# Patient Record
Sex: Male | Born: 1990 | Race: Black or African American | Hispanic: No | Marital: Single | Smoking: Never smoker
Health system: Southern US, Community
[De-identification: ages and names within clinical notes are randomized; demographics above are authoritative.]

## PROBLEM LIST (undated history)

## (undated) DIAGNOSIS — R569 Unspecified convulsions: Secondary | ICD-10-CM

---

## 2017-02-28 ENCOUNTER — Emergency Department (HOSPITAL_BASED_OUTPATIENT_CLINIC_OR_DEPARTMENT_OTHER): Payer: Medicaid Other

## 2017-02-28 ENCOUNTER — Emergency Department (HOSPITAL_BASED_OUTPATIENT_CLINIC_OR_DEPARTMENT_OTHER)
Admission: EM | Admit: 2017-02-28 | Discharge: 2017-02-28 | Disposition: A | Discharge status: Home / Self Care | Payer: Medicaid Other | Attending: Emergency Medicine | Admitting: Emergency Medicine

## 2017-02-28 ENCOUNTER — Encounter (HOSPITAL_BASED_OUTPATIENT_CLINIC_OR_DEPARTMENT_OTHER): Payer: Self-pay | Admitting: Emergency Medicine

## 2017-02-28 DIAGNOSIS — S6292XA Unspecified fracture of left wrist and hand, initial encounter for closed fracture: Secondary | ICD-10-CM | POA: Insufficient documentation

## 2017-02-28 DIAGNOSIS — Y929 Unspecified place or not applicable: Secondary | ICD-10-CM | POA: Insufficient documentation

## 2017-02-28 DIAGNOSIS — Z79899 Other long term (current) drug therapy: Secondary | ICD-10-CM | POA: Insufficient documentation

## 2017-02-28 DIAGNOSIS — S6992XA Unspecified injury of left wrist, hand and finger(s), initial encounter: Secondary | ICD-10-CM | POA: Diagnosis present

## 2017-02-28 DIAGNOSIS — S62002A Unspecified fracture of navicular [scaphoid] bone of left wrist, initial encounter for closed fracture: Secondary | ICD-10-CM

## 2017-02-28 DIAGNOSIS — Y9367 Activity, basketball: Secondary | ICD-10-CM | POA: Diagnosis not present

## 2017-02-28 DIAGNOSIS — W0110XA Fall on same level from slipping, tripping and stumbling with subsequent striking against unspecified object, initial encounter: Secondary | ICD-10-CM | POA: Diagnosis not present

## 2017-02-28 DIAGNOSIS — S62102A Fracture of unspecified carpal bone, left wrist, initial encounter for closed fracture: Secondary | ICD-10-CM

## 2017-02-28 DIAGNOSIS — Y999 Unspecified external cause status: Secondary | ICD-10-CM | POA: Diagnosis not present

## 2017-02-28 HISTORY — DX: Unspecified convulsions: R56.9

## 2017-02-28 MED ORDER — OXYCODONE-ACETAMINOPHEN 5-325 MG PO TABS
1.0000 | ORAL_TABLET | Freq: Once | ORAL | Status: AC
  Administered 2017-02-28: 1 via ORAL
  Filled 2017-02-28: qty 1

## 2017-02-28 MED ORDER — HYDROMORPHONE HCL 1 MG/ML IJ SOLN
1.0000 mg | Freq: Once | INTRAMUSCULAR | Status: AC
  Administered 2017-02-28: 1 mg via INTRAMUSCULAR
  Filled 2017-02-28: qty 1

## 2017-02-28 MED ORDER — ONDANSETRON 4 MG PO TBDP
4.0000 mg | ORAL_TABLET | Freq: Once | ORAL | Status: AC
  Administered 2017-02-28: 4 mg via ORAL
  Filled 2017-02-28: qty 1

## 2017-02-28 MED ORDER — NAPROXEN 500 MG PO TABS: 500.0000 mg | ORAL_TABLET | Freq: Two times a day (BID) | ORAL | 0 refills | Status: AC

## 2017-02-28 MED ORDER — HYDROCODONE-ACETAMINOPHEN 5-325 MG PO TABS: ORAL_TABLET | ORAL | 0 refills | Status: DC

## 2017-02-28 NOTE — Discharge Instructions (Signed)
Please read and follow all provided instructions.  Your diagnoses today include:  1. Avulsion fracture of left wrist   2. Closed displaced fracture of navicular bone of left foot, sequela     Tests performed today include:  An x-ray of the affected area - shows chip fracture of wrist, old wrist fracture with some bone breakdown  Vital signs. See below for your results today.   Medications prescribed:   Vicodin (hydrocodone/acetaminophen) - narcotic pain medication  DO NOT drive or perform any activities that require you to be awake and alert because this medicine can make you drowsy. BE VERY CAREFUL not to take multiple medicines containing Tylenol (also called acetaminophen). Doing so can lead to an overdose which can damage your liver and cause liver failure and possibly death.   Naproxen - anti-inflammatory pain medication  Do not exceed  naproxen every 12 hours, take with food  You have been prescribed an anti-inflammatory medication or NSAID. Take with food. Take smallest effective dose for the shortest duration needed for your pain. Stop taking if you experience stomach pain or vomiting.   Take any prescribed medications only as directed.  Home care instructions:   Follow any educational materials contained in this packet  Follow R.I.C.E. Protocol:  R - rest your injury   I  - use ice on injury without applying directly to skin  C - compress injury with bandage or splint  E - elevate the injury as much as possible  Follow-up instructions: Please follow-up with the hand doctor for further work-up.  Return instructions:   Please return if your fingers are numb or tingling, appear gray or blue, or you have severe pain (also elevate the arm and loosen splint or wrap if you were given one)  Please return to the Emergency Department if you experience worsening symptoms.   Please return if you have any other emergent concerns.  Additional Information:  Your  vital signs today were: BP (!) 147/99 (BP Location: Right Arm)    Pulse 72    Temp 97.8 F (36.6 C) (Oral)    Resp 18    Ht  (1.803 m)    Wt 81.6 kg (180 lb)    SpO2 100%    BMI 25.10 kg/m  If your blood pressure (BP) was elevated above 135/85 this visit, please have this repeated by your doctor within one month. --------------

## 2017-02-28 NOTE — ED Notes (Signed)
ED Provider at bedside. 

## 2017-02-28 NOTE — ED Notes (Signed)
Pt verbalized understanding of discharge instructions and denies any further questions at this time.   

## 2017-02-28 NOTE — ED Provider Notes (Addendum)
[facility] Provider [facility]   CSN: [id number] Arrival date & time: 02/28/17  1418     History   Chief Complaint Chief Complaint  Patient presents with  . Wrist Pain    HPI Kevin Sherman is a 26 y.o. male.  Patient who is right-handed, presents with complaint of left hand and wrist pain starting acutely at approximately 2 AM today when he fell while playing basketball. Patient fell onto an outstretched left wrist and hand. Patient had immediate severe pain. Patient states that he went home and could not sleep or change his newborn's diaper because of the pain. 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 He has had previous injuries which he did not seek medical care for. No treatments prior to arrival.       Past Medical History:  Diagnosis Date  . Seizures (HCC)     There are no active problems to display for this patient.   History reviewed. No pertinent surgical history.     Home Medications    Prior to Admission medications   Medication Sig Start Date End Date Taking? Authorizing Provider  phenytoin (DILANTIN) 300 MG ER capsule Take 400 mg by mouth daily.   Yes [provider]    Family History History reviewed. No pertinent family history.  Social History Social History  Substance Use Topics  . Smoking status: Never Smoker  . Smokeless tobacco: Never Used  . Alcohol use No     Allergies   Patient has no known allergies.   Review of Systems Review of Systems  Constitutional: Negative for activity change.  Musculoskeletal: Positive for arthralgias. Negative for back pain, gait problem, joint swelling and neck pain.  Skin: Negative for wound.  Neurological: Negative for weakness and numbness.     Physical Exam Updated Vital Signs BP (!) 160/95 (BP Location: Left Arm)   Pulse 82   Temp 97.8 F (36.6 C) (Oral)   Resp 18   Ht 5\' 11"  (1.803 m)   Wt 81.6 kg (180 lb)   SpO2 100%   BMI 25.10 kg/m   Physical Exam  Constitutional:  He appears well-developed and well-nourished.  HENT:  Head: Normocephalic and atraumatic.  Eyes: Conjunctivae are normal.  Neck: Normal range of motion. Neck supple.  Cardiovascular: Normal pulses.  Exam reveals no decreased pulses.   Pulses:      Radial pulses are 2+ on the right side, and 2+ on the left side.  Musculoskeletal: He exhibits tenderness. He exhibits no edema.       Left shoulder: Normal.       Left elbow: Normal.       Left wrist: He exhibits decreased range of motion, tenderness, bony tenderness and swelling.       Left forearm: Normal.       Left hand: He exhibits decreased range of motion, tenderness, bony tenderness (base of thumb) and swelling. He exhibits normal capillary refill, no deformity and no laceration.  Neurological: He is alert. No sensory deficit.  Motor, sensation, and vascular distal to the injury is fully intact.   Skin: Skin is warm and dry.  Psychiatric: He has a normal mood and affect.  Nursing note and vitals reviewed.    ED Treatments / Results  Labs (all labs ordered are listed, but only abnormal results are displayed) Labs Reviewed - No data to display  EKG  EKG Interpretation None       Radiology Dg Wrist Complete Left  Result Date: 02/28/2017  CLINICAL DATA:  26 year old male status post fall playing basketball last night. Hand and wrist pain and swelling. EXAM: LEFT WRIST - COMPLETE 3+ VIEW COMPARISON:  None. FINDINGS: The distal left ulna appears intact. There is a small crescent-shaped ossific fragment at left radial styloid along the radial carpal joint space (arrow). The distal left radius otherwise appears intact. There is an age indeterminate left scaphoid fracture with up to 2 mm of distraction of the proximal and distal scaphoid fragments. There is up to mild abnormal sclerosis of the scaphoid. There is subsequent mild subluxation of the proximal carpal row, but elsewhere the carpal bones appear intact and normally aligned. The  visible metacarpals appear intact. IMPRESSION: 1. Age indeterminate mildly distracted left scaphoid fracture. 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 Associated mild subluxation of the proximal carpal row. 2. Suspected acute chip or avulsion fracture at the nearby left radial styloid. Electronically Signed   By: Odessa Fleming M.D.   On: 02/28/2017 14:54   Dg Hand Complete Left  Result Date: 02/28/2017 CLINICAL DATA:  26 year old male status post fall playing basketball last night. Hand and wrist pain and swelling. EXAM: LEFT HAND - COMPLETE 3+ VIEW COMPARISON:  Left wrist series today reported separately. FINDINGS: Abnormal left wrist findings today are reported separately. Left metacarpals and phalanges are intact with normal alignment and joint spaces. IMPRESSION: 1. Abnormal left wrist, see left wrist series today. 2. Normal left metacarpals and phalanges. Electronically Signed   By: Odessa Fleming M.D.   On: 02/28/2017 14:55    Procedures Procedures (including critical care time)  Medications Ordered in ED Medications  oxyCODONE-acetaminophen (PERCOCET/ROXICET) 5-325 MG per tablet 1 tablet (1 tablet Oral Given 02/28/17 1600)  HYDROmorphone (DILAUDID) injection 1 mg (1 mg Intramuscular Given 02/28/17 1642)  ondansetron (ZOFRAN-ODT) disintegrating tablet 4 mg (4 mg Oral Given 02/28/17 1642)     Initial Impression / Assessment and Plan / ED Course  I have reviewed the triage vital signs and the nursing notes.  Pertinent labs & imaging results that were available during my care of the patient were reviewed by me and considered in my medical decision making (see chart for details).     Patient seen and examined. Reviewed x-ray with patient. We had a long discussion regarding importance of having navicular fracture evaluated and discussion of bone necrosis. Will splint given suspected avulsion fracture.  Patient is in significant pain. I do  not see any signs of ischemia to the hand or signs of compartment syndrome of the forearm. Will give IM dilaudid given fracture to obtain better pain control prior to discharge.   Vital signs reviewed and are as follows: BP (!) 147/99 (BP Location: Right Arm)   Pulse 72   Temp 97.8 F (36.6 C) (Oral)   Resp 18   Ht  (1.803 m)   Wt 81.6 kg (180 lb)   SpO2 100%   BMI 25.10 kg/m   5:15 PM Patients with significant pain relief with IM dilaudid. Will discharge to home with #8 Vicodin and naproxen. Patient encouraged to continue elevation, rice protocol.  Patient counseled on use of narcotic pain medications. Counseled not to combine these medications with others containing tylenol. Urged not to drink alcohol, drive, or perform any other activities that requires focus while taking these medications. The patient verbalizes understanding and agrees with the plan.  Patient was placed in a Velcro thumb spica wrist splint. I considered  fiberglass, however I think that this will be fine for follow-up. Given appearance of fracture and the lumen of sclerosis, do not suspect that the navicular fracture is acute. Suspect this is likely from a previous boxing injury. Fracture today is a small avulsion fracture.  Final Clinical Impressions(s) / ED Diagnoses   Final diagnoses:  Avulsion fracture of left wrist  Closed nondisplaced fracture of scaphoid of left wrist, unspecified portion of scaphoid, initial encounter   Patient with left wrist injury, splinted. 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 Hand is otherwise neurovascularly intact. No signs of compartment syndrome of the forearm. No elbow or shoulder pain.  New Prescriptions New Prescriptions   HYDROCODONE-ACETAMINOPHEN (NORCO/VICODIN) 5-325 MG TABLET    Take 1-2 tablets every 6 hours as needed for severe pain   NAPROXEN (NAPROSYN) 500 MG TABLET    Take 1 tablet (500 mg total)  by mouth 2 (two) times daily.     Renne Crigler, PA-C 02/28/17 1719    Nira Conn, MD 02/28/17 1741    Renne Crigler, PA-C 03/09/17 1609    Renne Crigler, PA-C 03/09/17 1610    Nira Conn, MD 03/13/17 1754

## 2017-02-28 NOTE — ED Notes (Signed)
Ice Pack on pt elevating extremity

## 2017-02-28 NOTE — ED Triage Notes (Signed)
Patient states that he was playing basketball last night and he fell onto his left hand and wrist. THe patient has swelling noted to his left hand and wrist

## 2017-02-28 NOTE — ED Notes (Signed)
Pt states that he does have a means of transportation and will not drive.

## 2018-05-25 ENCOUNTER — Emergency Department (HOSPITAL_BASED_OUTPATIENT_CLINIC_OR_DEPARTMENT_OTHER): Payer: Medicaid Other

## 2018-05-25 ENCOUNTER — Encounter (HOSPITAL_BASED_OUTPATIENT_CLINIC_OR_DEPARTMENT_OTHER): Payer: Self-pay | Admitting: Emergency Medicine

## 2018-05-25 ENCOUNTER — Other Ambulatory Visit: Payer: Self-pay

## 2018-05-25 ENCOUNTER — Emergency Department (HOSPITAL_BASED_OUTPATIENT_CLINIC_OR_DEPARTMENT_OTHER)
Admission: EM | Admit: 2018-05-25 | Discharge: 2018-05-26 | Disposition: A | Discharge status: Home / Self Care | Payer: Medicaid Other | Attending: Emergency Medicine | Admitting: Emergency Medicine

## 2018-05-25 DIAGNOSIS — Y92830 Public park as the place of occurrence of the external cause: Secondary | ICD-10-CM | POA: Insufficient documentation

## 2018-05-25 DIAGNOSIS — S99912A Unspecified injury of left ankle, initial encounter: Secondary | ICD-10-CM | POA: Diagnosis present

## 2018-05-25 DIAGNOSIS — Y9339 Activity, other involving climbing, rappelling and jumping off: Secondary | ICD-10-CM | POA: Insufficient documentation

## 2018-05-25 DIAGNOSIS — T1490XA Injury, unspecified, initial encounter: Secondary | ICD-10-CM

## 2018-05-25 DIAGNOSIS — S82842A Displaced bimalleolar fracture of left lower leg, initial encounter for closed fracture: Secondary | ICD-10-CM | POA: Insufficient documentation

## 2018-05-25 DIAGNOSIS — W098XXA Fall on or from other playground equipment, initial encounter: Secondary | ICD-10-CM | POA: Diagnosis not present

## 2018-05-25 DIAGNOSIS — Y998 Other external cause status: Secondary | ICD-10-CM | POA: Insufficient documentation

## 2018-05-25 DIAGNOSIS — S82843A Displaced bimalleolar fracture of unspecified lower leg, initial encounter for closed fracture: Secondary | ICD-10-CM

## 2018-05-25 MED ORDER — HYDROMORPHONE HCL 1 MG/ML IJ SOLN
1.0000 mg | Freq: Once | INTRAMUSCULAR | Status: AC
Start: 2018-05-25 — End: 2018-05-25
  Administered 2018-05-25: 1 mg via INTRAVENOUS
  Filled 2018-05-25: qty 1

## 2018-05-25 MED ORDER — ONDANSETRON HCL 4 MG/2ML IJ SOLN
4.0000 mg | Freq: Once | INTRAMUSCULAR | Status: AC
  Administered 2018-05-25: 4 mg via INTRAVENOUS
  Filled 2018-05-25: qty 2

## 2018-05-25 MED ORDER — FENTANYL CITRATE (PF) 100 MCG/2ML IJ SOLN
50.0000 ug | INTRAMUSCULAR | Status: DC | PRN
  Administered 2018-05-25: 50 ug via INTRAVENOUS
  Filled 2018-05-25: qty 2

## 2018-05-25 MED ORDER — MORPHINE SULFATE (PF) 4 MG/ML IV SOLN
4.0000 mg | Freq: Once | INTRAVENOUS | Status: AC
Start: 2018-05-25 — End: 2018-05-25
  Administered 2018-05-25: 4 mg via INTRAVENOUS
  Filled 2018-05-25: qty 1

## 2018-05-25 NOTE — ED Triage Notes (Signed)
Patient states he fell from the top of the playground equipment/slide; co left ankle pain and swelling; unable to bear weight on left ankle; states onset 1 hour ago.

## 2018-05-25 NOTE — ED Notes (Signed)
ED Provider at bedside. 

## 2018-05-25 NOTE — ED Notes (Signed)
Fell several feet landing on feet  More so on left leg and foot  Increased pain and swelling to left ankle and foot  Pos pedal pulse

## 2018-05-25 NOTE — Discharge Instructions (Addendum)
You have been seen today for an ankle injury.  There were breaks to some of the bones noted in the ankle and it is prudent that you follow-up with the orthopedic doctors. Acetaminophen (generic for Tylenol): Should you continue to have additional pain while taking the ibuprofen or naproxen, you may add in acetaminophen as needed. Your daily total maximum amount of acetaminophen from all sources should be limited to 4000mg /day for persons without liver problems, or 2000mg /day for those with liver problems. Elevation: Keep the extremity elevated as often as possible to reduce pain and inflammation. Support: Keep the splint clean and dry.  You will be nonweightbearing, which means you should not put any weight on the injured leg. Follow up: Call the office of the orthopedist on Monday, December 9 for an appointment within 2 days. Return: Return to the ED for numbness, weakness, increasing pain, overall worsening symptoms, loss of function, or if symptoms are not improving, you have tried to follow up with the orthopedic specialist, and have been unable to do so.  For prescription assistance, may try using prescription discount sites or apps, such as goodrx.com

## 2018-05-25 NOTE — ED Provider Notes (Signed)
[facility] EMERGENCY DEPARTMENT Provider Note   CSN: [id number] Arrival date & time: 05/25/18  1927     History   Chief Complaint Chief Complaint  Patient presents with  . Ankle Pain    HPI Kevin Sherman is a 27 y.o. male.  HPI   Kevin Sherman is a 27 y.o. male, with a history of seizures, presenting to the ED with injury to the left ankle that occurred around 5 PM this evening. Patient was at the park with his children, climbing on a jungle gym, lost his balance and fell 5 to 7 feet to the ground, landing on his left heel. Pain is throughout the left ankle, 10/10, sharp and throbbing, radiating proximally into the left lower leg. Denies head injury, neck/back pain, hip pain, abdominal pain, LOC, or any other complaints.   Past Medical History:  Diagnosis Date  . Seizures (HCC)     There are no active problems to display for this patient.   History reviewed. No pertinent surgical history.      Home Medications    Prior to Admission medications   Medication Sig Start Date End Date Taking? Authorizing Provider  HYDROcodone-acetaminophen (NORCO/VICODIN) 5-325 MG tablet Take 1-2 tablets every 6 hours as needed for severe pain 02/28/17   Renne CriglerGeiple, Joshua, PA-C  naproxen (NAPROSYN) 500 MG tablet Take 1 tablet (500 mg total) by mouth 2 (two) times daily. 02/28/17   Renne CriglerGeiple, Joshua, PA-C  phenytoin (DILANTIN) 300 MG ER capsule Take 400 mg by mouth daily.    [provider]    Family History History reviewed. No pertinent family history.  Social History Social History   Tobacco Use  . Smoking status: Never Smoker  . Smokeless tobacco: Never Used  Substance Use Topics  . Alcohol use: No  . Drug use: No     Allergies   Shellfish allergy   Review of Systems Review of Systems  Respiratory: Negative for shortness of breath.   Cardiovascular: Negative for chest pain.  Gastrointestinal: Negative for abdominal pain, nausea and vomiting.    Musculoskeletal: Positive for arthralgias and joint swelling. Negative for back pain and neck pain.  Neurological: Negative for dizziness, syncope, weakness, light-headedness, numbness and headaches.     Physical Exam Updated Vital Signs BP (!) 175/94 (BP Location: Left Arm) Comment: Patient is in severe pain.  Pulse 100   Temp 99 F (37.2 C) (Oral)   Resp 18   Ht 5\' 11"  (1.803 m)   Wt 86.2 kg   SpO2 100%   BMI 26.50 kg/m   Physical Exam  Constitutional: He appears well-developed and well-nourished. No distress.  HENT:  Head: Normocephalic and atraumatic.  Eyes: Conjunctivae are normal.  Neck: Normal range of motion. Neck supple.  Cardiovascular: Normal rate, regular rhythm and intact distal pulses.  Pulses:      Posterior tibial pulses are 2+ on the right side, and 2+ on the left side.  Pulmonary/Chest: Effort normal. No respiratory distress.  Abdominal: There is no guarding.  Musculoskeletal: He exhibits no edema.  Tenderness throughout the left ankle with swelling noted.  No noted deformity.  Normal motor function intact in all extremities. No midline spinal tenderness.  Range of motion without pain intact in the hips and knees bilaterally. No pain or range of motion abnormality in the right ankle.  Neurological: He is alert.  Sensation to light touch grossly intact throughout the bilateral lower extremities. Cranial nerves III through XII grossly intact. Strength 5/5 in the  upper and lower extremities. Motor function intact in the toes of the left foot.  Skin: Skin is warm and dry. He is not diaphoretic.  Psychiatric: He has a normal mood and affect. His behavior is normal.  Nursing note and vitals reviewed.    ED Treatments / Results  Labs (all labs ordered are listed, but only abnormal results are displayed) Labs Reviewed - No data to display  EKG None  Radiology Dg Ankle Complete Left  Result Date: 05/25/2018 CLINICAL DATA:  27 y/o M; fall injury,  unable to bear weight on left lower extremity, pain and swelling of left foot and ankle. EXAM: LEFT ANKLE COMPLETE - 3+ VIEW; LEFT FOOT - COMPLETE 3+ VIEW COMPARISON:  None. FINDINGS: Left foot: No acute fracture or dislocation of the foot bones. Lisfranc alignment is maintained. Irregular lucency within the anterior aspect of tibial plafond. Left ankle: Defect within the lateral aspect of talar dome. Ankle mortise is symmetric on these nonstress views. Well corticated ossific density inferior to the lateral malleolus. Irregular lucency within the anterior aspect of the tibial plafond. IMPRESSION: 1. Defect within the lateral aspect of talar dome likely representing an impaction/osteochondral fracture. 2. Irregular lucency within anterior tibial plafond without displacement, likely fracture. Electronically Signed   By: Mitzi Hansen M.D.   On: 05/25/2018 21:56   Dg Foot Complete Left  Result Date: 05/25/2018 CLINICAL DATA:  27 y/o M; fall injury, unable to bear weight on left lower extremity, pain and swelling of left foot and ankle. EXAM: LEFT ANKLE COMPLETE - 3+ VIEW; LEFT FOOT - COMPLETE 3+ VIEW COMPARISON:  None. FINDINGS: Left foot: No acute fracture or dislocation of the foot bones. Lisfranc alignment is maintained. Irregular lucency within the anterior aspect of tibial plafond. Left ankle: Defect within the lateral aspect of talar dome. Ankle mortise is symmetric on these nonstress views. Well corticated ossific density inferior to the lateral malleolus. Irregular lucency within the anterior aspect of the tibial plafond. IMPRESSION: 1. Defect within the lateral aspect of talar dome likely representing an impaction/osteochondral fracture. 2. Irregular lucency within anterior tibial plafond without displacement, likely fracture. Electronically Signed   By: Mitzi Hansen M.D.   On: 05/25/2018 21:56    Procedures Procedures (including critical care time)  Medications Ordered in  ED Medications  fentaNYL (SUBLIMAZE) injection 50 mcg (50 mcg Intravenous Given 05/25/18 2214)  HYDROmorphone (DILAUDID) injection 1 mg (has no administration in time range)  morphine 4 MG/ML injection 4 mg (4 mg Intravenous Given 05/25/18 2222)  ondansetron (ZOFRAN) injection 4 mg (4 mg Intravenous Given 05/25/18 2222)     Initial Impression / Assessment and Plan / ED Course  I have reviewed the triage vital signs and the nursing notes.  Pertinent labs & imaging results that were available during my care of the patient were reviewed by me and considered in my medical decision making (see chart for details).  Clinical Course as of May 25 2249  Fri May 25, 2018  2232 Spoke with Dr. Ophelia Charter, orthopedist. Recommend this CT of the ankle without contrast to better characterize the fracture.  Place patient in a Jones dressing with padding at least 2 inches thick, followed by Ace wrap. They will see the patient in the office December 9 or 10.   [SJ]    Clinical Course User Index [SJ] Joy, Shawn C, PA-C    Patient presents with injury to the left lower leg.  Suspected fractures as noted on ankle x-ray.  Neurovascularly intact.  Dr. Rhunette Croft will follow up on patient's imaging.     Final Clinical Impressions(s) / ED Diagnoses   Final diagnoses:  Injury of left ankle, initial encounter    ED Discharge Orders    None       Concepcion Living 05/25/18 2255    Derwood Kaplan, MD 05/26/18 9604    Derwood Kaplan, MD 05/26/18 [phone]

## 2018-05-26 MED ORDER — MORPHINE SULFATE (PF) 4 MG/ML IV SOLN
8.0000 mg | Freq: Once | INTRAVENOUS | Status: AC
  Administered 2018-05-26: 8 mg via INTRAVENOUS
  Filled 2018-05-26: qty 2

## 2018-05-26 MED ORDER — HYDROCODONE-ACETAMINOPHEN 5-325 MG PO TABS: ORAL_TABLET | ORAL | 0 refills | Status: DC

## 2018-05-26 MED ORDER — HYDROCODONE-ACETAMINOPHEN 5-325 MG PO TABS: 1.0000 | ORAL_TABLET | Freq: Three times a day (TID) | ORAL | 0 refills | Status: AC | PRN

## 2018-05-26 NOTE — ED Notes (Signed)
PMS intact before and after. Pt tolerated well. All questions answered. RN Sue LushAndrea assisted with short leg splint and stirrup. Orders verified by MD Nanavati.

## 2018-05-28 ENCOUNTER — Ambulatory Visit (INDEPENDENT_AMBULATORY_CARE_PROVIDER_SITE_OTHER): Payer: Medicaid Other | Admitting: Orthopaedic Surgery

## 2018-05-28 ENCOUNTER — Encounter (INDEPENDENT_AMBULATORY_CARE_PROVIDER_SITE_OTHER): Payer: Self-pay | Admitting: Orthopaedic Surgery

## 2018-05-28 VITALS — BP 147/88 | HR 88 | Ht 71.0 in | Wt 185.0 lb

## 2018-05-28 DIAGNOSIS — S82871A Displaced pilon fracture of right tibia, initial encounter for closed fracture: Secondary | ICD-10-CM

## 2018-05-28 DIAGNOSIS — S92142A Displaced dome fracture of left talus, initial encounter for closed fracture: Secondary | ICD-10-CM | POA: Diagnosis not present

## 2018-05-28 MED ORDER — OXYCODONE-ACETAMINOPHEN 5-325 MG PO TABS: 1.0000 | ORAL_TABLET | Freq: Four times a day (QID) | ORAL | 0 refills | Status: AC | PRN

## 2018-05-28 NOTE — Progress Notes (Signed)
Office Visit Note   Patient: Kevin Sherman           Date of Birth: [birth year]           MRN: [id number] Visit Date: 05/28/2018              Requested by: No referring provider defined for this encounter. PCP: Kevin Sherman   Assessment & Plan: Visit Diagnoses:  1. Displaced pilon fracture of right tibia, initial encounter for closed fracture   2. Closed displaced fracture of dome of left talus, initial encounter     Plan: Patient has ankle impaction injury with some depression of the talar dome and also anterolateral tibial plafond fracture with 1 mm gap in the articular surface.  Patient will be managed nonweightbearing with nonoperative treatment.  Prescription for Percocet given 5/325.  Complete nonweightbearing foot elevation above heart, office follow-up 8 days for splint removal and short leg nonweightbearing cast.  We discussed the importance of being nonweightbearing to to prevent further impaction or displacement of the fracture.  Potential for the development of possible arthritis posttraumatic at some point in the future.  Anterior lateral portion of the dome of the talus has 1 mm impaction but the posterior aspect is in satisfactory elevation.  CT scan was reviewed with patient, rationale for treatment discussed.  Return 8 days for splint removal and short leg nonweightbearing cast.  Follow-Up Instructions: Return in about 8 days (around 06/05/2018).   Orders:  No orders of the defined types were placed in this encounter.  Meds ordered this encounter  Medications  . oxyCODONE-acetaminophen (PERCOCET/ROXICET) 5-325 MG tablet    Sig: Take 1 tablet by mouth every 6 (six) hours as needed for severe pain.    Dispense:  30 tablet    Refill:  0      Procedures: No procedures performed   Clinical Data: No additional findings.   Subjective: Chief Complaint  Patient presents with  . Left Ankle - Fracture    DOI 05/25/18    HPI 27 year old male was climbing on  a jungle gym with his kids got up on top of the swing set which was about the height of 8 feet and slipped and fell off the top landing on his ankle and falling backwards.  Date of injury was 05/25/2018.  Patient states he has seizures and he is disabled.  Patient denies having a seizure after he climbed on the top of the swing set in the park.  X-rays in the emergency room demonstrated a defect the lateral aspect the talar dome with impaction injury.  Lucency on the anterior tibial plafond without displacement consistent with a fracture.  CT scan was obtained for definitive diagnosis of his injuries and this showed an acute slightly comminuted fracture of the anterior aspect the tibial plafond extending into the ankle joint.  Osteochondral impaction the lateral aspect of the talar dome with flattening.  Small ankle effusion.  Intact tendons across the joint.  Patient was placed in a splint and has been nonweightbearing with crutches.  Splint is in satisfactory condition.  He has been elevating his foot and took off 15 of the Norco tablets 5/325 that were given in the emergency room on 05/25/2018 3 days ago.  Review of Systems patient's been healthy and active has a diagnosis of seizures.  He is here with a friend and also his young son.  Patient is on Dilantin for seizures also taking anti-inflammatory.  Patient's a non-smoker negative  cardiovascular endocrine, GI GU.  14 point systems negative other than as mentioned above and in HPI.   Objective: Vital Signs: BP (!) 147/88   Pulse 88   Ht 5\' 11"  (1.803 m)   Wt 185 lb (83.9 kg)   BMI 25.80 kg/m   Physical Exam  Constitutional: He is oriented to person, place, and time. He appears well-developed and well-nourished.  HENT:  Head: Normocephalic and atraumatic.  Eyes: Pupils are equal, round, and reactive to light. EOM are normal.  Neck: No tracheal deviation present. No thyromegaly present.  Cardiovascular: Normal rate.  Pulmonary/Chest: Effort  normal. He has no wheezes.  Abdominal: Soft. Bowel sounds are normal.  Neurological: He is alert and oriented to person, place, and time.  Skin: Skin is warm and dry. Capillary refill takes less than 2 seconds.  Psychiatric: He has a normal mood and affect. His behavior is normal. Judgment and thought content normal.    Ortho Exam patient has normal full range of motion upper extremities wrist fingers no evidence of injury other than the involved left ankle.  Sensation of the foot is intact there is mild swelling no ecchymosis of the toes.  Specialty Comments:  No specialty comments available.  Imaging: CLINICAL DATA:  Severe left ankle pain after fall from jungle gym 2 hours ago.  EXAM: CT OF THE LEFT ANKLE WITHOUT CONTRAST  TECHNIQUE: Multidetector CT imaging of the left ankle was performed according to the standard protocol. Multiplanar CT image reconstructions were also generated.  COMPARISON:  Same day radiographs of the tibia and fibula as well as of the left foot.  FINDINGS: Bones/Joint/Cartilage  There is an acute slightly comminuted fracture involving the anterior aspect of the tibial epiphysis and metaphysis extending through the tibial plafond and into the ankle joint. Osteochondral impaction fracture along the lateral aspect of the talar dome is also noted, series 8/images 27 and 28 accounting for a slightly flattened appearance on the sagittal reformats. Small curvilinear ossification is noted along the posteromedial aspect of the ankle joint without definite donor site which may reflect stigmata of old remote trauma or tendinosis. No joint dislocation of the tibiotalar, subtalar and midfoot articulations. Small well corticated ossification off the anterolateral process of the calcaneus may represent a small accessory ossicle. Additional ossicle off the tip of the fibula. Small joint effusion.  Ligaments  Suboptimally assessed by CT.  Muscles and  Tendons  No intramuscular hemorrhage or atrophy. The Achilles tendon is normal. Soft tissue swelling and edema outlining the peroneal brevis and longus tendons are noted with soft tissue swelling over the lateral aspect of the ankle. The posteromedial and extensor tendons. Largely intact without entrapment.  Soft tissues  Soft tissue swelling about the anterolateral aspect of the ankle. No fluid collections.  IMPRESSION: 1. There is an acute, slightly comminuted fracture involving the anterior aspect of the tibial epiphysis and metaphysis extending through the tibial plafond and into the ankle joint. Osteochondral impaction fracture along the lateral aspect of the talar dome is also noted, series 8/images 27 and 28 accounting for a slightly flattened appearance on the sagittal reformats. Small ankle joint effusion. 2. No joint dislocation of the tibiotalar, subtalar and midfoot articulations. 3. Largely intact tendons crossing the ankle joint though the peroneal tendons are somewhat obscured by soft tissue swelling seen.   Electronically Signed   By: Tollie Eth M.D.   On: 05/25/2018 23:57   PMFS History: There are no active problems to display for this patient.  Past Medical History:  Diagnosis Date  . Seizures (HCC)     No family history on file.  No past surgical history on file. Social History   Occupational History  . Not on file  Tobacco Use  . Smoking status: Never Smoker  . Smokeless tobacco: Never Used  Substance and Sexual Activity  . Alcohol use: No  . Drug use: No  . Sexual activity: Not on file

## 2018-06-06 ENCOUNTER — Telehealth (INDEPENDENT_AMBULATORY_CARE_PROVIDER_SITE_OTHER): Payer: Self-pay | Admitting: Orthopaedic Surgery

## 2018-06-06 ENCOUNTER — Encounter (INDEPENDENT_AMBULATORY_CARE_PROVIDER_SITE_OTHER): Payer: Self-pay | Admitting: Surgery

## 2018-06-06 ENCOUNTER — Ambulatory Visit (INDEPENDENT_AMBULATORY_CARE_PROVIDER_SITE_OTHER): Payer: Medicaid Other | Admitting: Surgery

## 2018-06-06 VITALS — BP 145/97 | HR 74 | Temp 97.3°F | Ht 71.0 in | Wt 185.0 lb

## 2018-06-06 DIAGNOSIS — S82871A Displaced pilon fracture of right tibia, initial encounter for closed fracture: Secondary | ICD-10-CM

## 2018-06-06 MED ORDER — OXYCODONE-ACETAMINOPHEN 5-325 MG PO TABS: 1.0000 | ORAL_TABLET | Freq: Three times a day (TID) | ORAL | 0 refills | Status: AC | PRN

## 2018-06-06 NOTE — Telephone Encounter (Signed)
Patient called advised he went to the pharmacy to get Rx filed (Oxycodone) and was told the Rx needed prior approval. [facility] [address] [place]. The number to contact patient is [phone]

## 2018-06-06 NOTE — Telephone Encounter (Signed)
Status is now pending on the web site will continue to monitor for approval this afternoon.

## 2018-06-06 NOTE — Telephone Encounter (Signed)
Prior auth submitted through [place] [employer] portal for [employer] rx written by Fayrene Fearingjames today. Status suspended will require further review. Will continue to monitor site for further instruction. Pt is aware.

## 2018-06-07 NOTE — Telephone Encounter (Signed)
I called pt and advised that auth was obtained. Pt states that he already paid out of pocket for the medication. I advised that if a refill is needed and approved that the authorization is in place. To call with questions.

## 2018-06-07 NOTE — Telephone Encounter (Signed)
Checked prior Berkley Harveyauth has been approved. Confirmation # [id number] I will call pharm to advise of this.

## 2018-06-27 ENCOUNTER — Encounter (INDEPENDENT_AMBULATORY_CARE_PROVIDER_SITE_OTHER): Payer: Self-pay | Admitting: Surgery

## 2018-06-27 ENCOUNTER — Ambulatory Visit (INDEPENDENT_AMBULATORY_CARE_PROVIDER_SITE_OTHER): Payer: Self-pay

## 2018-06-27 ENCOUNTER — Ambulatory Visit (INDEPENDENT_AMBULATORY_CARE_PROVIDER_SITE_OTHER): Payer: Medicaid Other | Admitting: Surgery

## 2018-06-27 DIAGNOSIS — S92142A Displaced dome fracture of left talus, initial encounter for closed fracture: Secondary | ICD-10-CM

## 2018-06-27 DIAGNOSIS — S82871A Displaced pilon fracture of right tibia, initial encounter for closed fracture: Secondary | ICD-10-CM

## 2018-06-27 MED ORDER — HYDROCODONE-ACETAMINOPHEN 7.5-325 MG PO TABS: 1.0000 | ORAL_TABLET | Freq: Three times a day (TID) | ORAL | 0 refills | Status: AC | PRN

## 2018-06-27 NOTE — Progress Notes (Signed)
28 year old black male was about 4 weeks out from distal tibia fracture and lateral talar dome impaction injury returns. Last office visit he was put in a short leg fiberglass cast and has been nonweightbearing.  Exam Pleasant black male alert and oriented in no acute distress.  Calf is nontender.  Neuro vas intact.  Ankle is tender to palpation.   Plan X-rays from today reviewed with Dr. Ophelia CharterYates.  He stated it is okay to put him in a fracture boot and can do partial weightbearing.  Dr. Ophelia CharterYates stated he can also begin some light range of motion of the ankle.  Patient does have a small child that he cares for.  I advised him that if he thinks it will be easier for him to go back into a short leg fiberglass cast for another week or 2 he can call us and let us know and we will have this applied.  Refill prescription for Norco 7.5/325.

## 2018-07-11 ENCOUNTER — Ambulatory Visit (INDEPENDENT_AMBULATORY_CARE_PROVIDER_SITE_OTHER): Payer: Medicaid Other | Admitting: Orthopaedic Surgery

## 2018-07-18 ENCOUNTER — Ambulatory Visit (INDEPENDENT_AMBULATORY_CARE_PROVIDER_SITE_OTHER): Payer: Medicaid Other | Admitting: Orthopaedic Surgery

## 2018-07-18 ENCOUNTER — Encounter (INDEPENDENT_AMBULATORY_CARE_PROVIDER_SITE_OTHER): Payer: Self-pay | Admitting: Orthopaedic Surgery

## 2018-07-18 VITALS — BP 130/75 | HR 68 | Ht 71.0 in | Wt 185.0 lb

## 2018-07-18 DIAGNOSIS — S82871A Displaced pilon fracture of right tibia, initial encounter for closed fracture: Secondary | ICD-10-CM

## 2018-07-18 DIAGNOSIS — S92142D Displaced dome fracture of left talus, subsequent encounter for fracture with routine healing: Secondary | ICD-10-CM

## 2018-07-18 NOTE — Progress Notes (Signed)
Office Visit Note   Patient: Kevin Sherman           Date of Birth: 10-Aug-1990           MRN: [id number] Visit Date: 07/18/2018              Requested by: No referring provider defined for this encounter. PCP: Patient, No Pcp Per   Assessment & Plan: Visit Diagnoses:  1. Displaced pilon fracture of right tibia, initial encounter for closed fracture   2. Closed displaced fracture of dome of left talus with routine healing, subsequent encounter     Plan: Patient can use a cam boot begin partial weightbearing and progress as tolerated.  If he gets increased pain Ihe will back off pressure.  Reviewed previous x-rays.  He understands he has had an impaction injury to the joint but overall alignment is satisfactory.  I will check him back again in 1 month.  Repeat x-rays on return left ankle.  Follow-Up Instructions: Return in about 1 month (around 08/18/2018).   Orders:  No orders of the defined types were placed in this encounter.  No orders of the defined types were placed in this encounter.     Procedures: No procedures performed   Clinical Data: No additional findings.   Subjective: Chief Complaint  Patient presents with  . Left Ankle - Follow-up    HPI follow-up impaction injury left ankle.  He has been partial weightbearing with Cam boot.  Review of Systems updated unchanged from previous office visit.   Objective: Vital Signs: BP 130/75   Pulse 68   Ht 5\' 11"  (1.803 m)   Wt 185 lb (83.9 kg)   BMI 25.80 kg/m   Physical Exam Constitutional:      Appearance: He is well-developed.  HENT:     Head: Normocephalic and atraumatic.  Eyes:     Pupils: Pupils are equal, round, and reactive to light.  Neck:     Thyroid: No thyromegaly.     Trachea: No tracheal deviation.  Cardiovascular:     Rate and Rhythm: Normal rate.  Pulmonary:     Effort: Pulmonary effort is normal.     Breath sounds: No wheezing.  Abdominal:     General: Bowel sounds are normal.     Palpations: Abdomen is soft.  Skin:    General: Skin is warm and dry.     Capillary Refill: Capillary refill takes less than 2 seconds.  Neurological:     Mental Status: He is alert and oriented to person, place, and time.  Psychiatric:        Behavior: Behavior normal.        Thought Content: Thought content normal.        Judgment: Judgment normal.     Ortho Exam minimal swelling of the ankle.  Sensation is intact.  Specialty Comments:  No specialty comments available.  Imaging: No results found.   PMFS History: Patient Active Problem List   Diagnosis Date Noted  . Displaced pilon fracture of right tibia, initial encounter for closed fracture 05/28/2018  . Closed displaced dome fracture of left talus 05/28/2018   Past Medical History:  Diagnosis Date  . Seizures (HCC)     History reviewed. No pertinent family history.  History reviewed. No pertinent surgical history. Social History   Occupational History  . Not on file  Tobacco Use  . Smoking status: Never Smoker  . Smokeless tobacco: Never Used  Substance and Sexual Activity  .  Alcohol use: No  . Drug use: No  . Sexual activity: Not on file

## 2018-08-21 ENCOUNTER — Ambulatory Visit (INDEPENDENT_AMBULATORY_CARE_PROVIDER_SITE_OTHER): Payer: Medicaid Other | Admitting: Orthopaedic Surgery

## 2018-09-24 ENCOUNTER — Encounter (INDEPENDENT_AMBULATORY_CARE_PROVIDER_SITE_OTHER): Payer: Self-pay | Admitting: Surgery

## 2018-09-24 NOTE — Progress Notes (Signed)
Patient returns for recheck.  He is a couple weeks out from distal tibia fracture and lateral talar dome impaction injury..  Doing well.  Needs refill of Percocet.  Exam Patient is obviously tender over the fracture site.  Calf nontender. Neurovascular intact.  Plan Patient was put into a short leg fiberglass cast.  Still be nonweightbearing.  Follow-up in a few weeks for recheck.  Continue to elevate as much as possible to help decrease pain and swelling.

## 2018-12-10 ENCOUNTER — Telehealth: Payer: Self-pay

## 2018-12-10 NOTE — Telephone Encounter (Signed)
Copied from [place] [id number]. Topic: General - Inquiry >> Dec 10, 2018  1:15 PM Selinda Flavin B, NT wrote: Reason for CRM: Calling and states that he was a patient of Dr Zigmund Daniel when he practiced at [facility]. States that his family now see him. States that Dr Zigmund Daniel has been asking about him and wanting him to call the office. Patient states that he is covered by Medicaid; aware that medicaid is not accepted as a primary insurance. States that if you just tell Dr Zigmund Daniel my name and phone number he will know who I am. Would like a call from Dr Zigmund Daniel. CB#: [phone]

## 2018-12-11 NOTE — Telephone Encounter (Signed)
Copied from [place] [id number]. Topic: Appointment Scheduling - Scheduling Inquiry for Clinic >> Dec 10, 2018  4:29 PM Kevin Sherman wrote: Reason for CRM: Patient is requesting a new patient appt with Dr.Matthew.  Patient states he used to see Dr.Matthews at old practice.   Call back # [phone]

## 2018-12-11 NOTE — Telephone Encounter (Signed)
Called patient this morning to schedule appt from CRM. Could not leave a vm for patient.

## 2018-12-13 NOTE — Telephone Encounter (Signed)
Called Pt. He was driving, tried to make appt with Dr. Zigmund Daniel. He needed to call back later to schedule appt.

## 2020-08-09 IMAGING — DX DG KNEE COMPLETE 4+V*L*
4 series · 4 of 4 positions shown · non-contrast
Comparison: Left foot radiograph dated 05/25/2018 and ankle CT
dated 05/25/2018

CLINICAL DATA: 19-year-old male with fall.

EXAM:
LEFT TIBIA AND FIBULA - 2 VIEW; LEFT KNEE - COMPLETE 4+ VIEW

[knee ap]
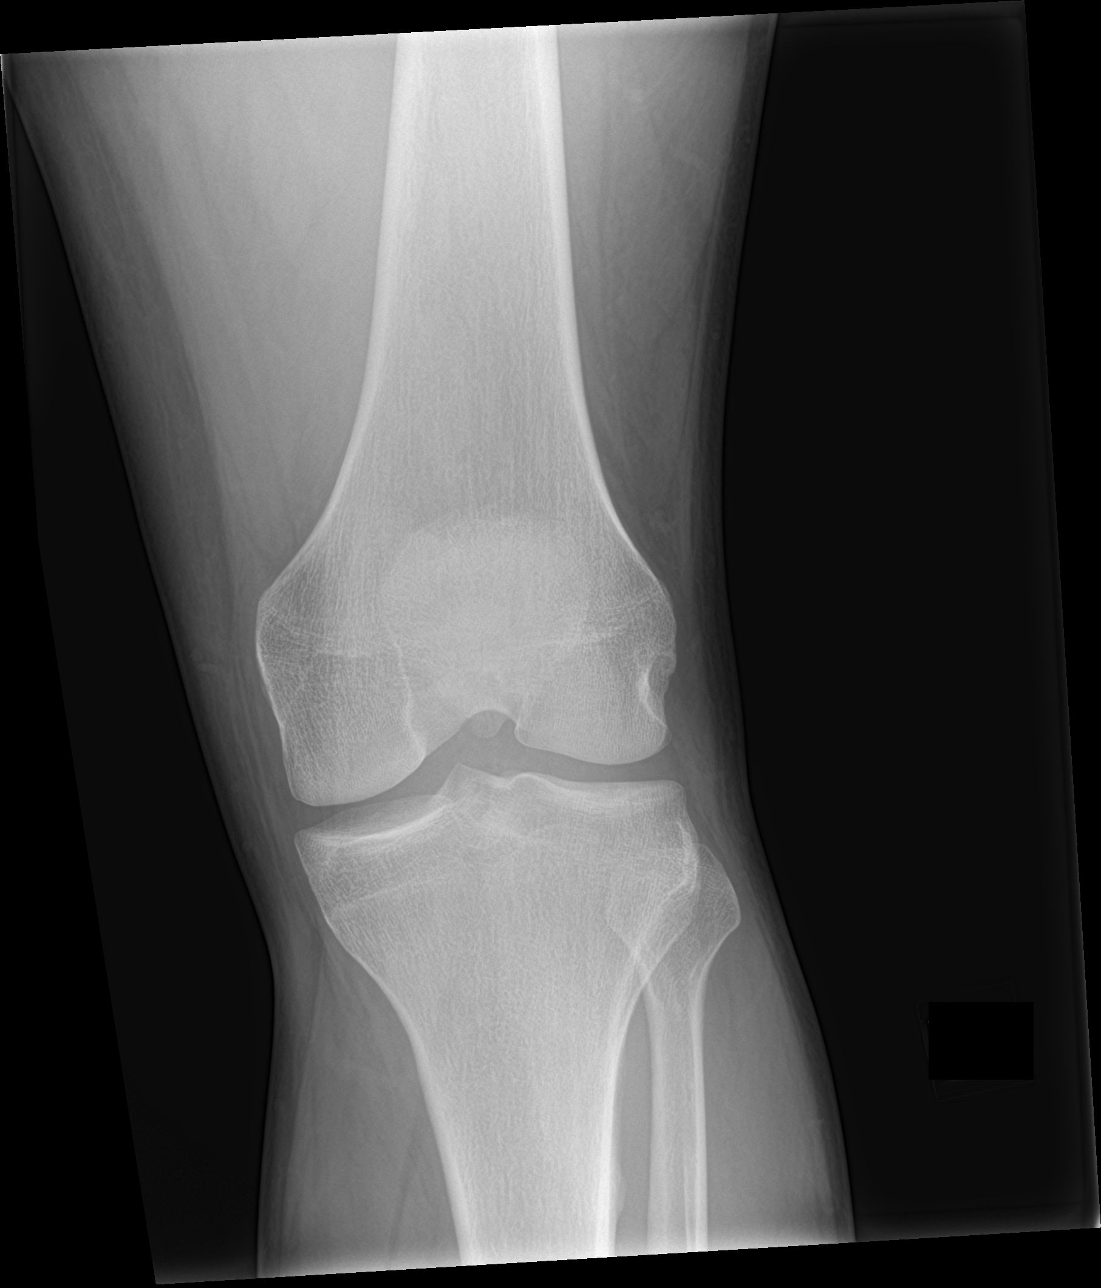

[knee lat]
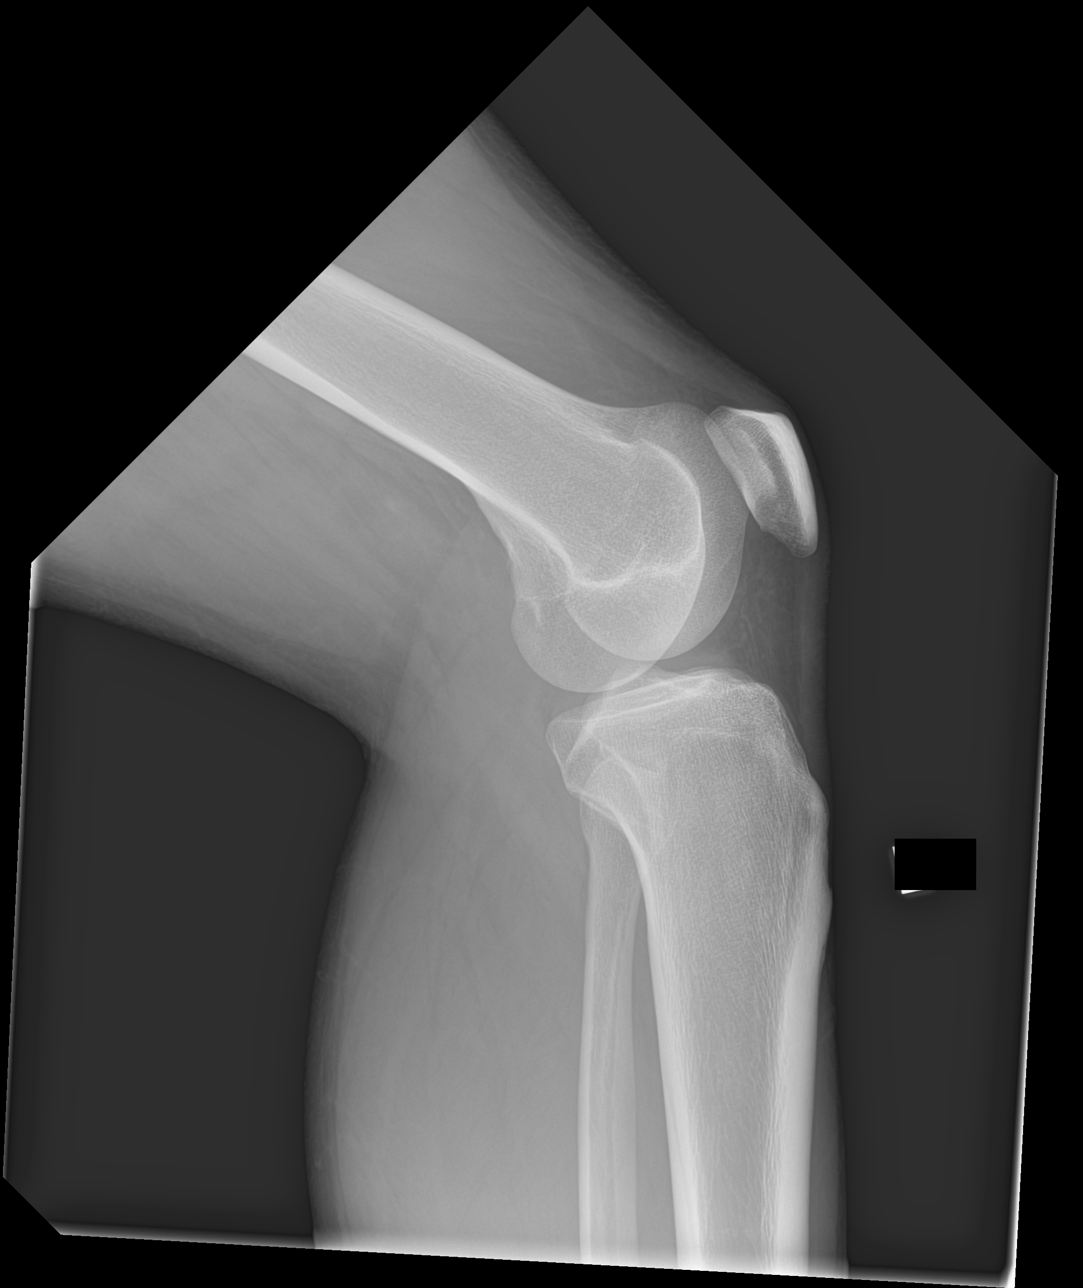

[knee obl (1 of 2)]
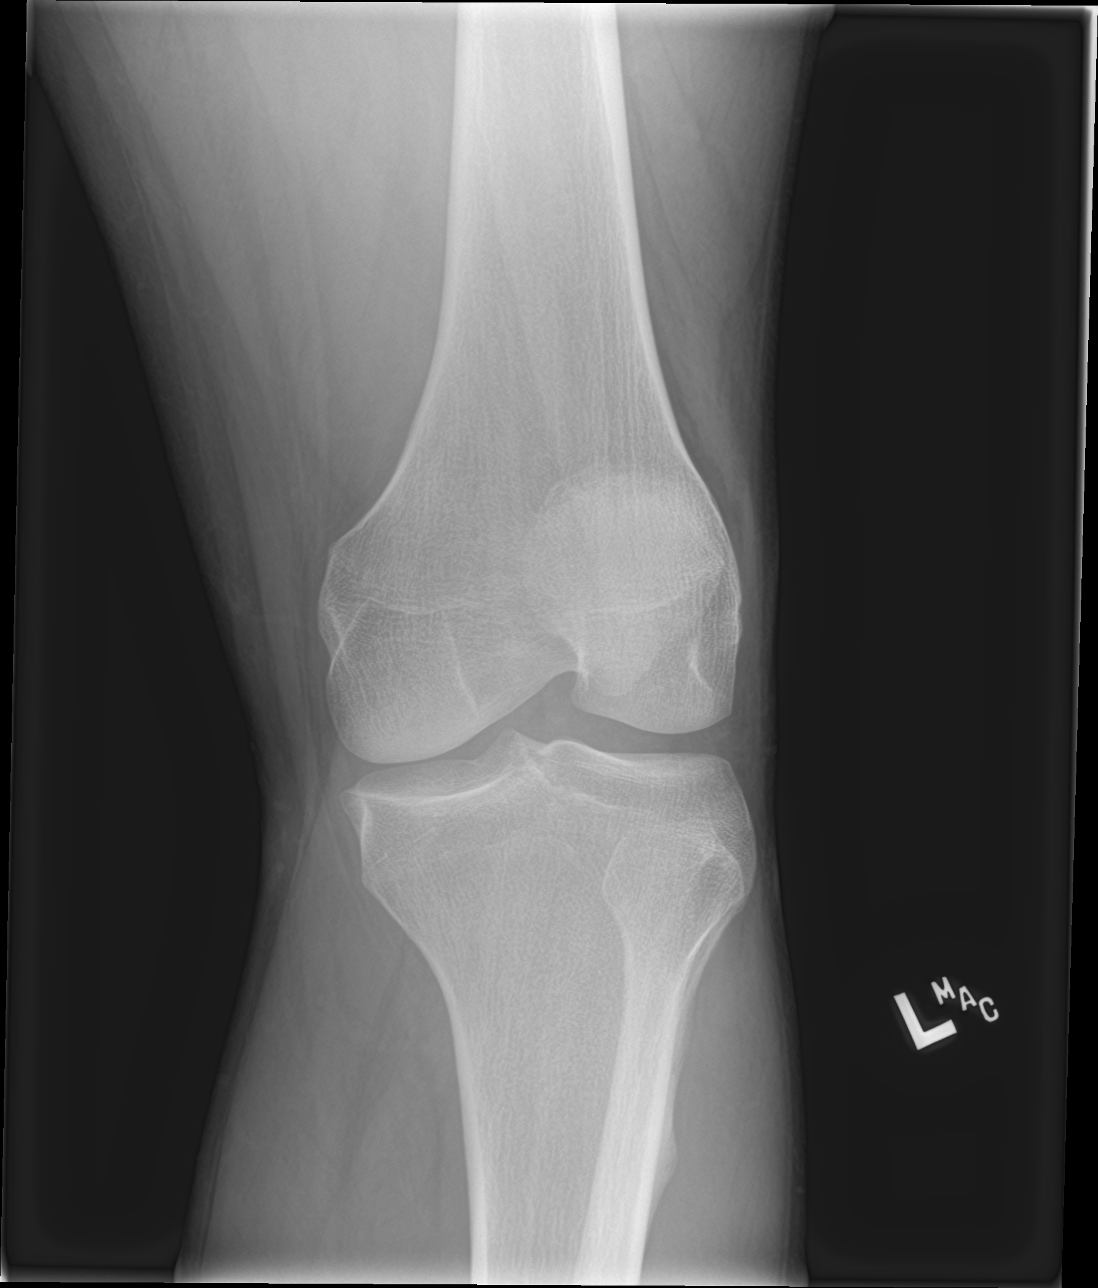

[knee obl (2 of 2)]
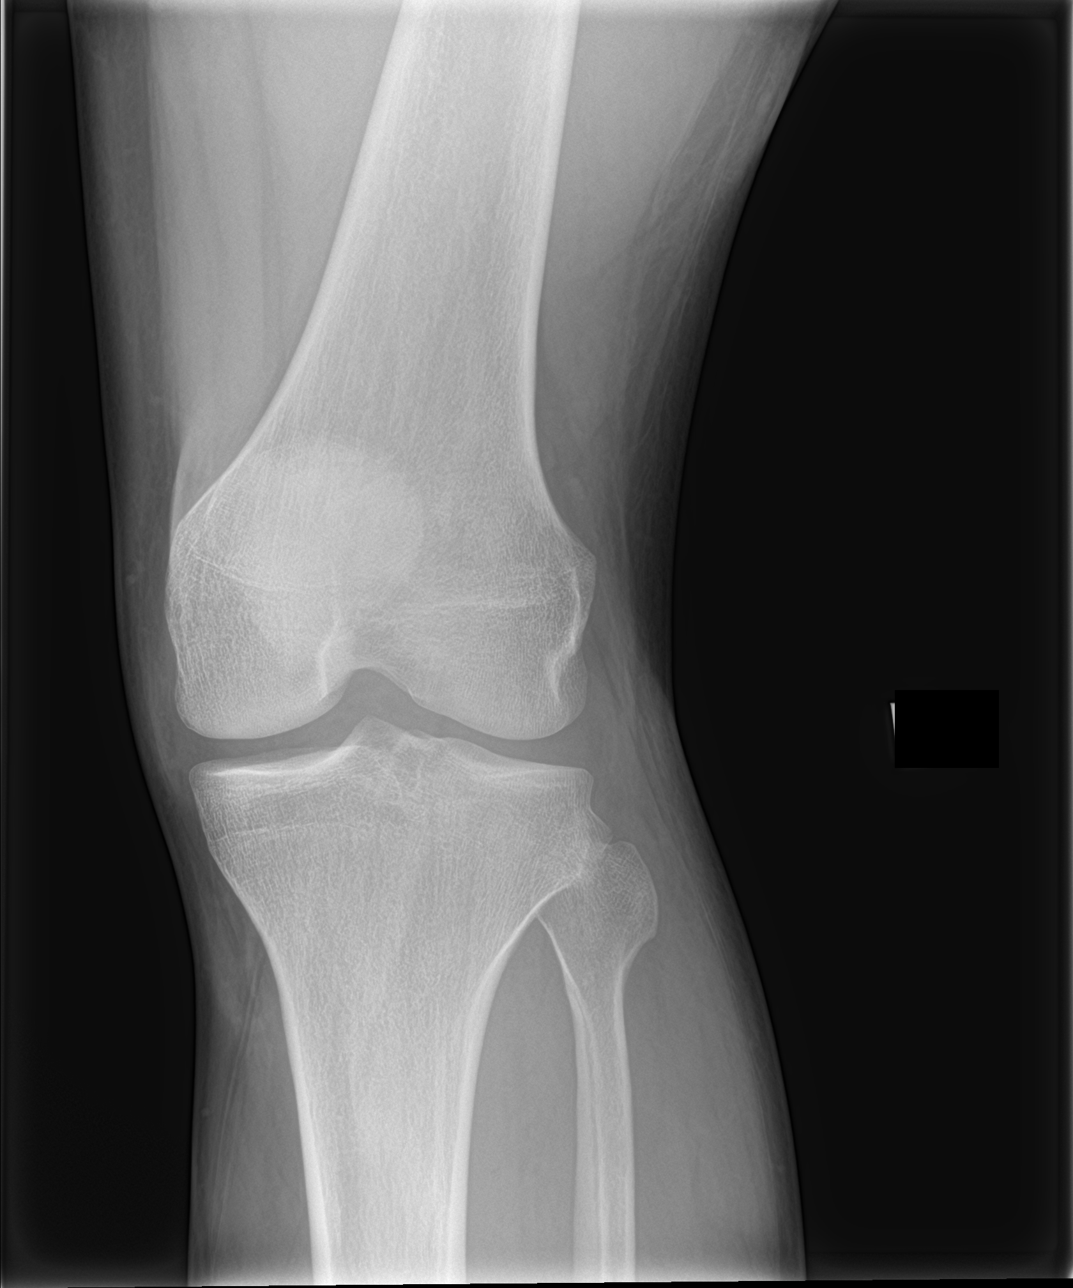

[4 of 4 positions shown; findings below may reference images not displayed]

FINDINGS: Minimally displaced fracture of the anterior tibial plafond.. A
small bone fragment noted inferior to the medial malleolus. There is
a focal area of mildly depressed fracture of the lateral aspect of
the talar dome. Slight asymmetric widening of the lateral ankle
mortise. No dislocation. The soft tissues appear unremarkable.
IMPRESSION: 1. Fractures of the anterior tibial plafond, left talar dome as well
as a small bone fragment inferior to the medial malleolus.
2. Mildly widened lateral ankle mortise.  No dislocation.

## 2020-08-09 IMAGING — CR DG FOOT COMPLETE 3+V*L*
3 series · 3 of 3 positions shown · non-contrast
Comparison: None.

CLINICAL DATA: 27 y/o M; fall injury, unable to bear weight on left
lower extremity, pain and swelling of left foot and ankle.

EXAM:
LEFT ANKLE COMPLETE - 3+ VIEW; LEFT FOOT - COMPLETE 3+ VIEW

[t foot lat left]
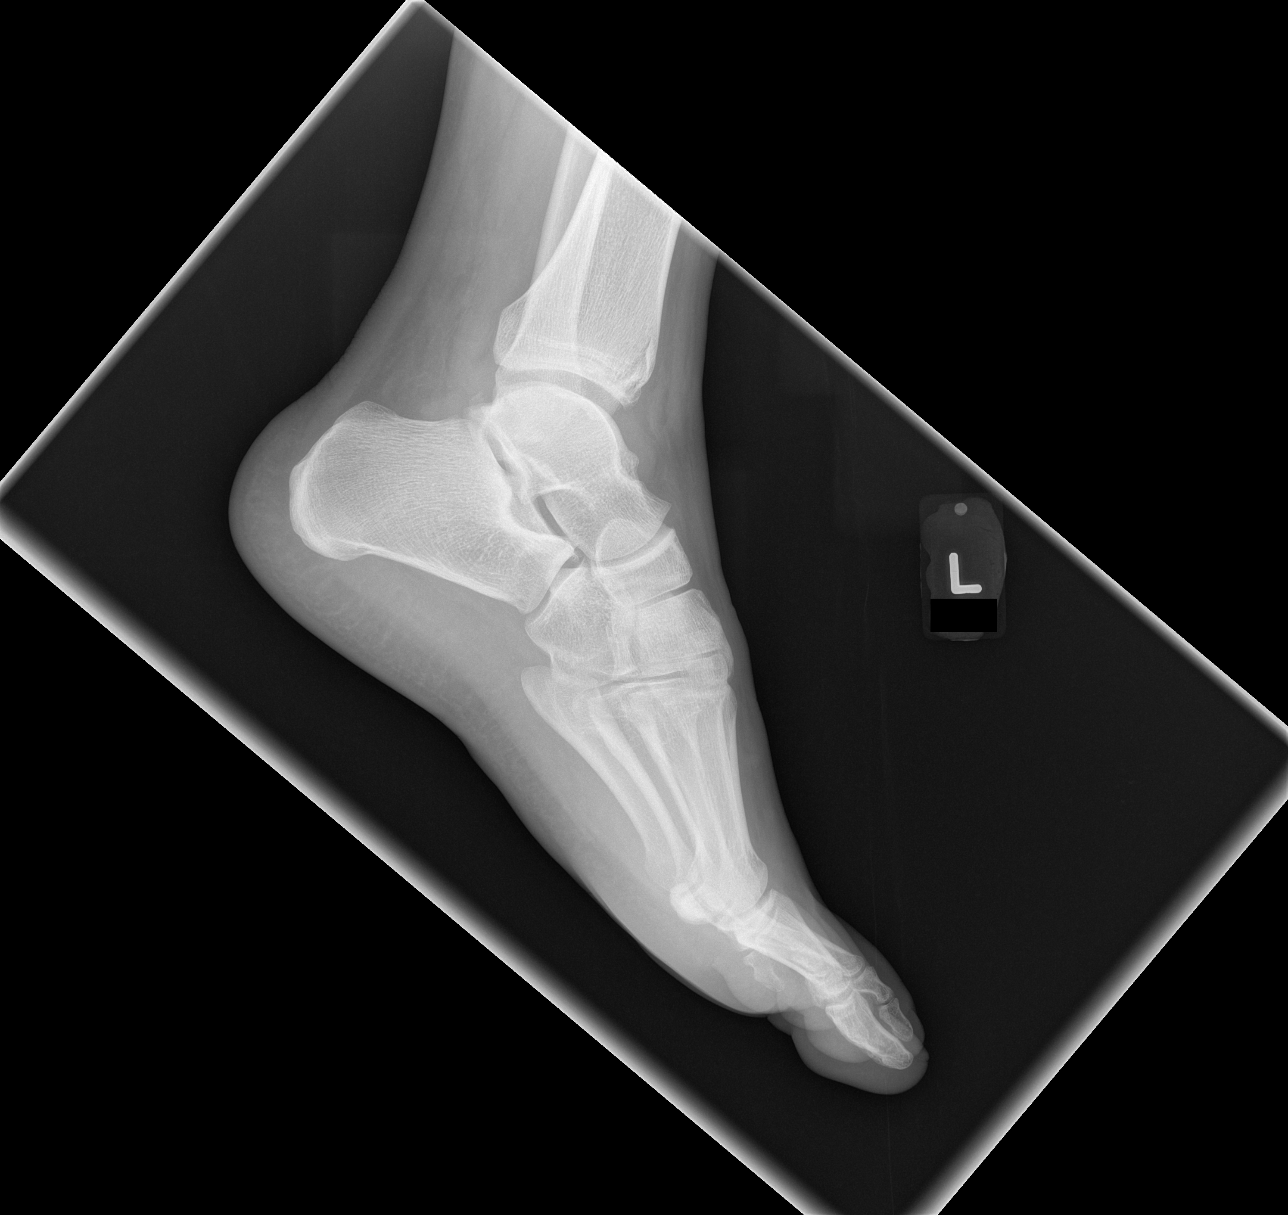

[t foot ap left]
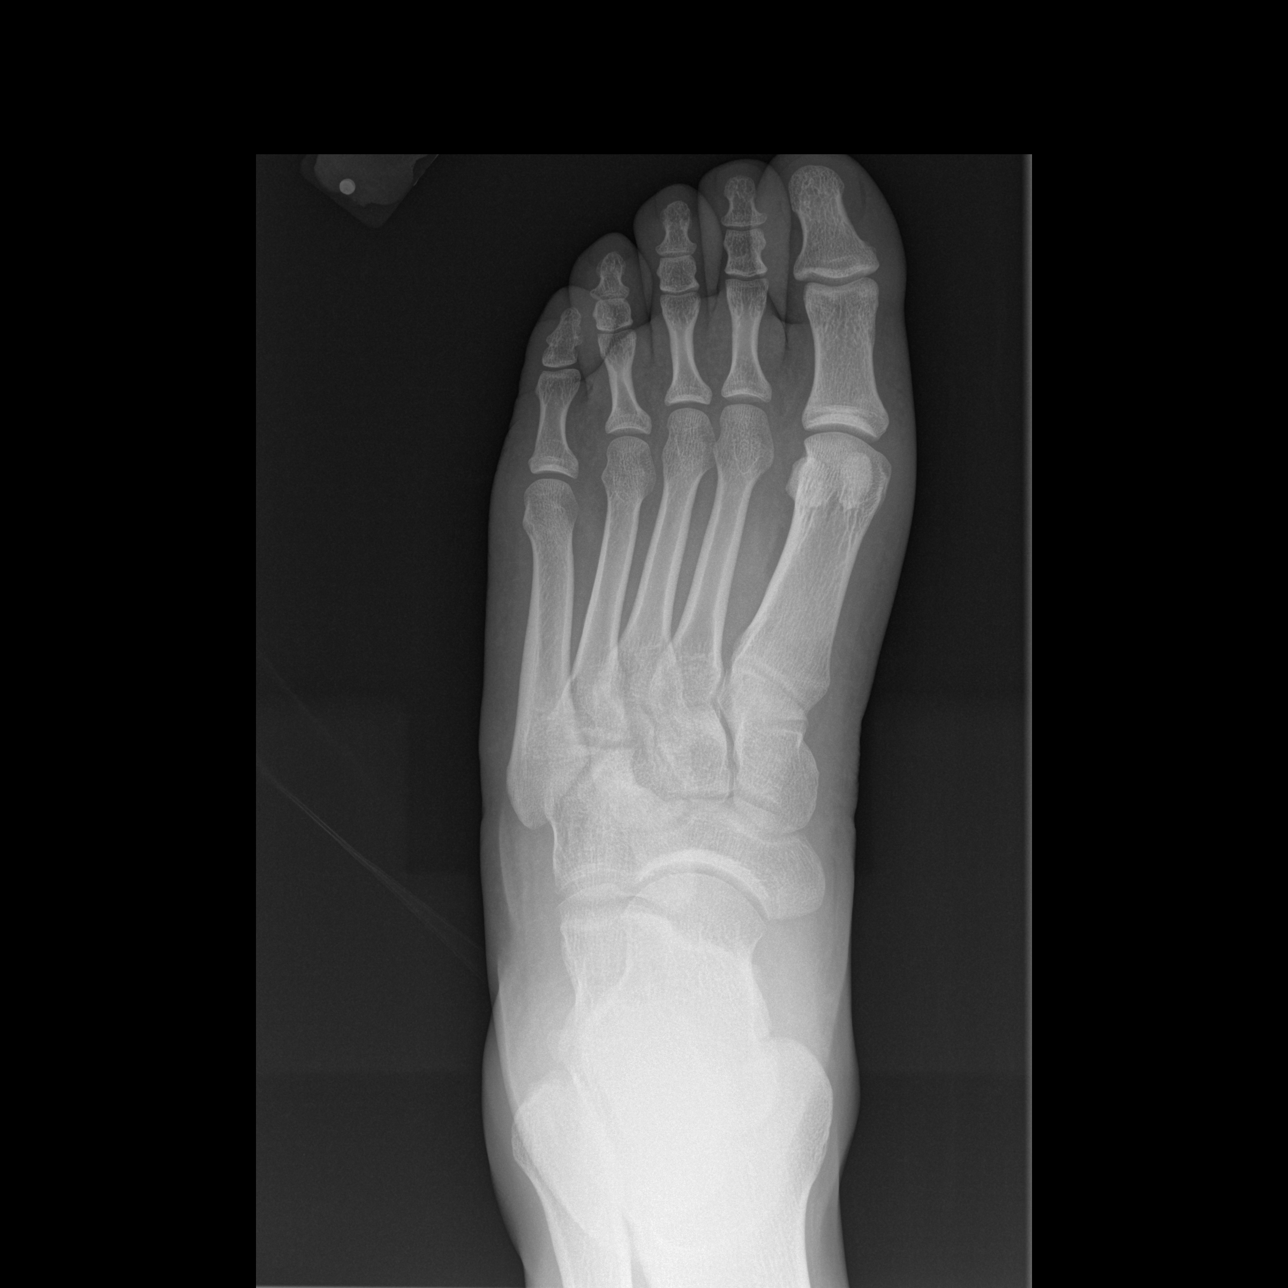

[t foot oblique left]
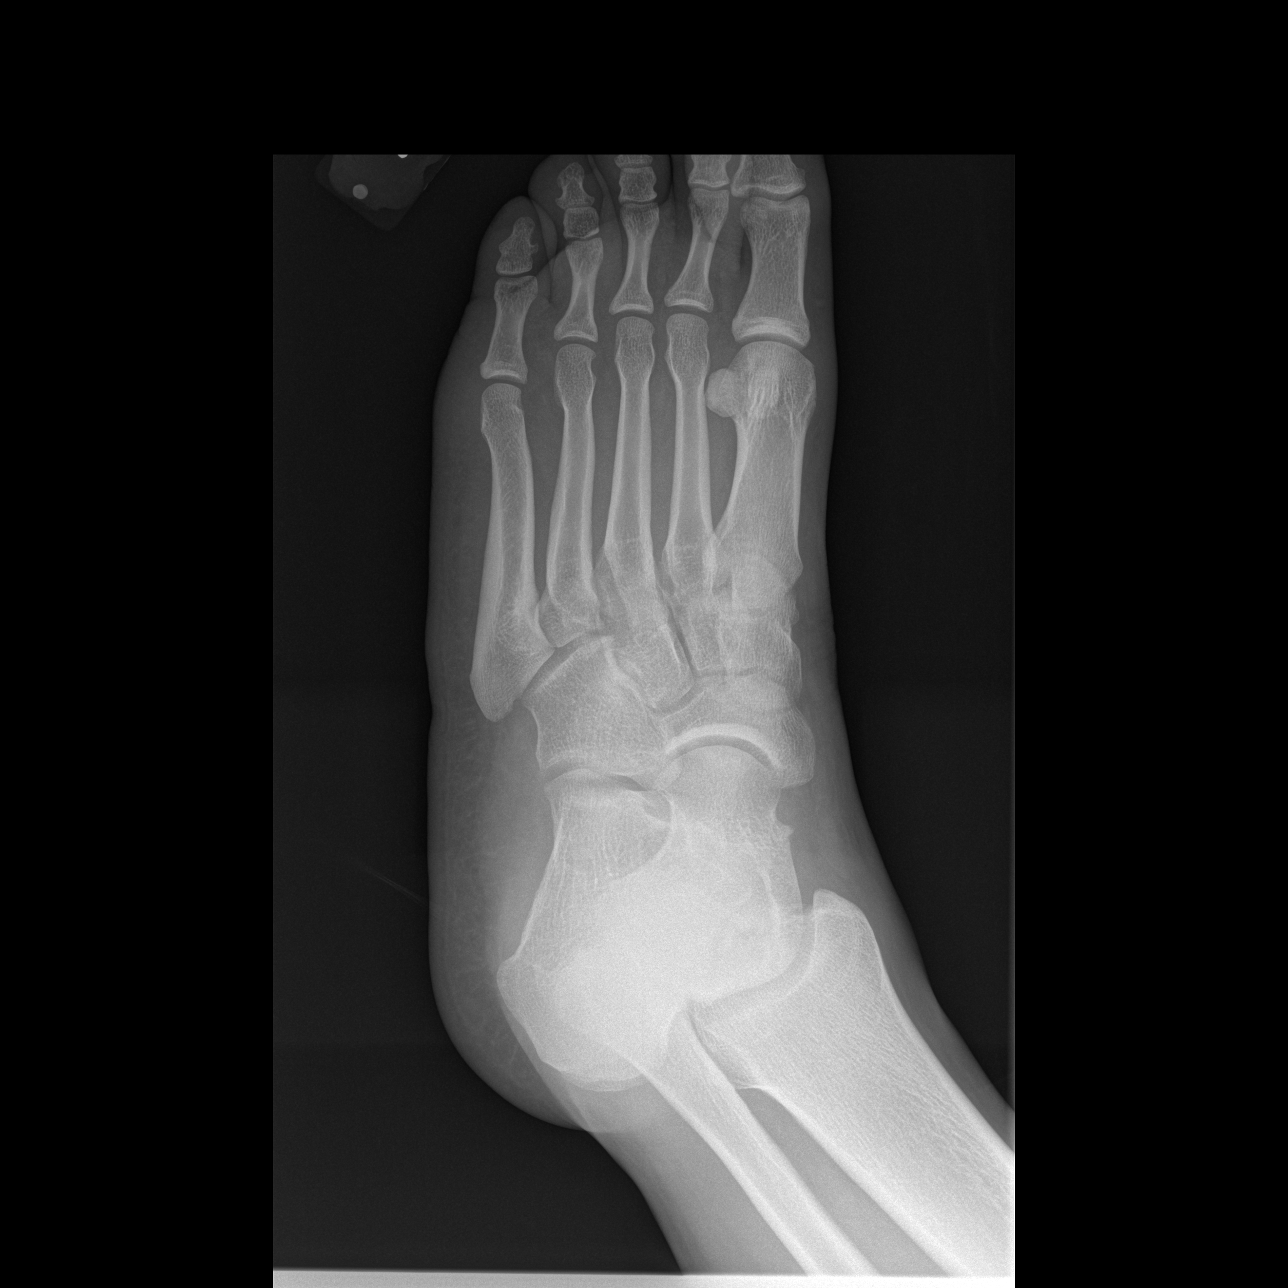

[3 of 3 positions shown; findings below may reference images not displayed]

FINDINGS: Left foot:

No acute fracture or dislocation of the foot bones. Lisfranc
alignment is maintained. Irregular lucency within the anterior
aspect of tibial plafond.

Left ankle:

Defect within the lateral aspect of talar dome. Ankle mortise is
symmetric on these nonstress views. Well corticated ossific density
inferior to the lateral malleolus. Irregular lucency within the
anterior aspect of the tibial plafond.
IMPRESSION: 1. Defect within the lateral aspect of talar dome likely
representing an impaction/osteochondral fracture.
2. Irregular lucency within anterior tibial plafond without
displacement, likely fracture.
